# Patient Record
Sex: Male | Born: 2000 | Race: Black or African American | Hispanic: No | Marital: Single | Smoking: Never smoker
Health system: Southern US, Community
[De-identification: ages and names within clinical notes are randomized; demographics above are authoritative.]

## PROBLEM LIST (undated history)

## (undated) DIAGNOSIS — T7840XA Allergy, unspecified, initial encounter: Secondary | ICD-10-CM

## (undated) DIAGNOSIS — F909 Attention-deficit hyperactivity disorder, unspecified type: Secondary | ICD-10-CM

## (undated) DIAGNOSIS — L309 Dermatitis, unspecified: Secondary | ICD-10-CM

## (undated) HISTORY — DX: Allergy, unspecified, initial encounter: T78.40XA

## (undated) HISTORY — DX: Dermatitis, unspecified: L30.9

## (undated) HISTORY — DX: Attention-deficit hyperactivity disorder, unspecified type: F90.9

---
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
## 2021-10-04 ENCOUNTER — Ambulatory Visit: Payer: BC Managed Care – PPO | Admitting: Family Medicine

## 2021-10-04 ENCOUNTER — Other Ambulatory Visit: Payer: Self-pay

## 2021-10-04 ENCOUNTER — Encounter: Payer: Self-pay | Admitting: Family Medicine

## 2021-10-04 VITALS — BP 112/78 | HR 90 | Temp 97.6°F | Ht 65.0 in | Wt 131.2 lb

## 2021-10-04 DIAGNOSIS — Z209 Contact with and (suspected) exposure to unspecified communicable disease: Secondary | ICD-10-CM

## 2021-10-04 DIAGNOSIS — F988 Other specified behavioral and emotional disorders with onset usually occurring in childhood and adolescence: Secondary | ICD-10-CM

## 2021-10-04 DIAGNOSIS — Z1159 Encounter for screening for other viral diseases: Secondary | ICD-10-CM

## 2021-10-04 DIAGNOSIS — L309 Dermatitis, unspecified: Secondary | ICD-10-CM

## 2021-10-04 DIAGNOSIS — Z23 Encounter for immunization: Secondary | ICD-10-CM

## 2021-10-04 DIAGNOSIS — Z0000 Encounter for general adult medical examination without abnormal findings: Secondary | ICD-10-CM

## 2021-10-04 MED ORDER — AMPHETAMINE-DEXTROAMPHETAMINE 10 MG PO TABS: 10.0000 mg | ORAL_TABLET | Freq: Every day | ORAL | 0 refills | Status: DC

## 2021-10-04 NOTE — Patient Instructions (Signed)
Try the medication and let me know if it works, how long it works and did she have any trouble when you are on it or coming off

## 2021-10-04 NOTE — Progress Notes (Signed)
   Subjective:    Patient ID: Stephen Collier, male    DOB: 18-Apr-2001, 20 y.o.   MRN: [id number]  HPI He is here for complete examination.  He has no particular concerns or complaints.  Does indicate that he was diagnosed with ADD and did use a medication until he was 14 and then stopped it on his own.  Since then he has gone on and has finished his degree and is now finished an associates degree.  He is potentially interested in trying the medication again to see if it might possibly help with focus he does not smoke and rarely drinks.  He does have a history of eczema and does use topical medications.  He did have 1 sexual experience some with a man and did wear a condom.  Otherwise he says that he is not sexually active.  Social and family history was reviewed.   Review of Systems  All other systems reviewed and are negative.     Objective:   Physical Exam Alert and in no distress. Tympanic membranes and canals are normal. Pharyngeal area is normal. Neck is supple without adenopathy or thyromegaly. Cardiac exam shows a regular sinus rhythm without murmurs or gallops. Lungs are clear to auscultation.  Abdominal exam shows no masses or tenderness.  [name] shows normal circumcised male.  Penis and testes normal.  Exam of his arms does show hyper and hypopigmentation present. ADD screening does indicate that he indeed does have it.       Assessment & Plan:  Routine general medical examination at a health care facility - Plan: CBC with Differential/Platelet, Comprehensive metabolic panel, Lipid panel  Eczema, unspecified type  Contact with or exposure to communicable disease - Plan: RPR+HIV+GC+CT Panel  Attention deficit disorder, unspecified hyperactivity presence - Plan: amphetamine-dextroamphetamine (ADDERALL) 10 MG tablet  Need for Tdap vaccination - Plan: Tdap vaccine greater than or equal to 7yo IM  Need for influenza vaccination - Plan: Flu Vaccine QUAD 51mo+IM (Fluarix, Fluzone  & Alfiuria Quad PF)  Need for hepatitis C screening test - Plan: Hepatitis C antibody Continue to treat his eczema as needed.  I will do STD testing on him.  Discussed wearing condoms which she does.  Also discussed use of PrEP but at this point he is not interested as his sexual activity is quite limited.  I discussed possibly getting hepatitis A injections but again at this point he is not planning on becoming sexually active. Concerning his ADD, he will try the medicine.  He is supposed to let me know if it works and if he has any side effects from that.

## 2021-10-07 LAB — CBC WITH DIFFERENTIAL/PLATELET
Basophils Absolute: 0 10*3/uL (ref 0.0–0.2)
Basos: 1 %
EOS (ABSOLUTE): 0.1 10*3/uL (ref 0.0–0.4)
Eos: 3 %
Hematocrit: 46.3 % (ref 37.5–51.0)
Hemoglobin: 15.7 g/dL (ref 13.0–17.7)
Immature Grans (Abs): 0 10*3/uL (ref 0.0–0.1)
Immature Granulocytes: 0 %
Lymphocytes Absolute: 1.2 10*3/uL (ref 0.7–3.1)
Lymphs: 41 %
MCH: 29 pg (ref 26.6–33.0)
MCHC: 33.9 g/dL (ref 31.5–35.7)
MCV: 86 fL (ref 79–97)
Monocytes Absolute: 0.5 10*3/uL (ref 0.1–0.9)
Monocytes: 15 %
Neutrophils Absolute: 1.2 10*3/uL — ABNORMAL LOW (ref 1.4–7.0)
Neutrophils: 40 %
Platelets: 193 10*3/uL (ref 150–450)
RBC: 5.41 x10E6/uL (ref 4.14–5.80)
RDW: 12.9 % (ref 11.6–15.4)
WBC: 3 10*3/uL — ABNORMAL LOW (ref 3.4–10.8)

## 2021-10-07 LAB — LIPID PANEL
Chol/HDL Ratio: 4.3 ratio (ref 0.0–5.0)
Cholesterol, Total: 179 mg/dL (ref 100–199)
HDL: 42 mg/dL (ref >39)
LDL Chol Calc (NIH): 127 mg/dL — ABNORMAL HIGH (ref 0–99)
Triglycerides: 50 mg/dL (ref 0–149)
VLDL Cholesterol Cal: 10 mg/dL (ref 5–40)

## 2021-10-07 LAB — COMPREHENSIVE METABOLIC PANEL
ALT: 15 IU/L (ref 0–44)
AST: 18 IU/L (ref 0–40)
Albumin/Globulin Ratio: 1.8 (ref 1.2–2.2)
Albumin: 5.1 g/dL (ref 4.1–5.2)
Alkaline Phosphatase: 71 IU/L (ref 44–121)
BUN/Creatinine Ratio: 13 (ref 9–20)
BUN: 16 mg/dL (ref 6–20)
Bilirubin Total: 0.7 mg/dL (ref 0.0–1.2)
CO2: 24 mmol/L (ref 20–29)
Calcium: 9.9 mg/dL (ref 8.7–10.2)
Chloride: 103 mmol/L (ref 96–106)
Creatinine, Ser: 1.2 mg/dL (ref 0.76–1.27)
Globulin, Total: 2.9 g/dL (ref 1.5–4.5)
Glucose: 74 mg/dL (ref 70–99)
Potassium: 4.5 mmol/L (ref 3.5–5.2)
Sodium: 141 mmol/L (ref 134–144)
Total Protein: 8 g/dL (ref 6.0–8.5)
eGFR: 88 mL/min/{1.73_m2} (ref >59)

## 2021-10-07 LAB — HEPATITIS C ANTIBODY: Hep C Virus Ab: <0.1 s/co ratio (ref 0.0–0.9)

## 2021-10-07 LAB — RPR+HIV+GC+CT PANEL
Chlamydia trachomatis, NAA: NEGATIVE
HIV Screen 4th Generation wRfx: NONREACTIVE
Neisseria Gonorrhoeae by PCR: NEGATIVE
RPR Ser Ql: NONREACTIVE

## 2021-11-07 ENCOUNTER — Telehealth: Payer: Self-pay | Admitting: Family Medicine

## 2021-11-07 DIAGNOSIS — F988 Other specified behavioral and emotional disorders with onset usually occurring in childhood and adolescence: Secondary | ICD-10-CM

## 2021-11-07 MED ORDER — AMPHETAMINE-DEXTROAMPHETAMINE 10 MG PO TABS: 10.0000 mg | ORAL_TABLET | Freq: Every day | ORAL | 0 refills | Status: DC

## 2021-11-07 MED ORDER — METHYLPHENIDATE HCL 10 MG PO TABS: 10.0000 mg | ORAL_TABLET | Freq: Every day | ORAL | 0 refills | Status: DC

## 2021-11-07 NOTE — Telephone Encounter (Signed)
Called pt to advise. No answer and couldn't lvm. KH

## 2021-11-07 NOTE — Telephone Encounter (Signed)
Pt called and states that adderall 10 mg is working fine. He states that the only thing he noticed is that he does not have much of a appetite. Pt can be reached at [phone].

## 2021-11-07 NOTE — Addendum Note (Signed)
Addended by: Ronnald Nian on: 11/07/2021 01:53 PM   Modules accepted: Orders

## 2021-11-08 NOTE — Telephone Encounter (Signed)
Pt was advised KH 

## 2022-01-10 ENCOUNTER — Encounter: Payer: BC Managed Care – PPO | Admitting: Family Medicine

## 2022-07-04 ENCOUNTER — Telehealth: Payer: Self-pay | Admitting: Family Medicine

## 2022-07-04 NOTE — Telephone Encounter (Signed)
Called pt no answer, wouldn't let me leave message

## 2022-07-04 NOTE — Telephone Encounter (Signed)
Pt called and states he has decided he wants to be sexually active and would like you to call him to discuss this.  Please call his cell #[phone]

## 2022-07-05 NOTE — Telephone Encounter (Signed)
Appt made

## 2022-07-06 ENCOUNTER — Encounter: Payer: Self-pay | Admitting: Family Medicine

## 2022-07-06 ENCOUNTER — Ambulatory Visit: Payer: BC Managed Care – PPO | Admitting: Family Medicine

## 2022-07-06 VITALS — BP 110/70 | HR 98 | Temp 97.3°F | Wt 127.2 lb

## 2022-07-06 DIAGNOSIS — Z209 Contact with and (suspected) exposure to unspecified communicable disease: Secondary | ICD-10-CM | POA: Diagnosis not present

## 2022-07-06 DIAGNOSIS — Z23 Encounter for immunization: Secondary | ICD-10-CM | POA: Diagnosis not present

## 2022-07-06 NOTE — Patient Instructions (Signed)
For the Truvada you can take 2 pills prior to sexual activity and then 1 a day for 2 days. Take 2 of the doxycycline prior to sexual activity and then you are done

## 2022-07-06 NOTE — Progress Notes (Signed)
   Subjective:    Patient ID: Stephen Collier, male    DOB: [birth year], 21 y.o.   MRN: [id number]  HPI He is here for consult concerning starting PrEP.  He is up-to-date on hepatitis B vaccine but has not had hepatitis A.  He is also not have HPV vaccine.   Review of Systems     Objective:   Physical Exam Alert and in no distress otherwise not examined       Assessment & Plan:  Need for HPV vaccination - Plan: HPV 9-valent vaccine,Recombinat  Need for influenza vaccination - Plan: CANCELED: Flu Vaccine QUAD 37mo+IM (Fluarix, Fluzone & Alfiuria Quad PF)  Need for hepatitis A vaccination - Plan: Hepatitis A vaccine adult IM  Contact with or exposure to communicable disease - Plan: RPR+HIV+GC+CT Panel I will get his immunizations up-to-date with HPV as well as hepatitis A.  We will also make sure he does not have any STDs.  Then discussed the use of Truvada/Descovy taking it on a daily basis.  Also discussed the possibility of taking it on an as-needed basis using the protocol of 2 initially and then 1 a day for 2 days.  Also discussed the use of once every 2 months shots for prevention.  I also discussed prevention of other STDs with use of doxycycline as well as definitely always using condoms.  Recommend follow-up on a 80-month basis on that.

## 2022-07-07 LAB — RPR+HIV+GC+CT PANEL
HIV Screen 4th Generation wRfx: NONREACTIVE
RPR Ser Ql: NONREACTIVE

## 2022-07-08 LAB — RPR+HIV+GC+CT PANEL: Chlamydia trachomatis, NAA: NEGATIVE

## 2022-07-09 MED ORDER — EMTRICITABINE-TENOFOVIR DF 200-300 MG PO TABS: 1.0000 | ORAL_TABLET | Freq: Every day | ORAL | 0 refills | Status: DC

## 2022-07-09 NOTE — Addendum Note (Signed)
Addended by: Ronnald Nian on: 07/09/2022 11:43 AM   Modules accepted: Orders

## 2022-07-11 NOTE — Progress Notes (Signed)
Called pt and advised of results and Truvada

## 2022-07-31 ENCOUNTER — Other Ambulatory Visit: Payer: Self-pay | Admitting: Family Medicine

## 2022-07-31 MED ORDER — EMTRICITABINE-TENOFOVIR DF 200-300 MG PO TABS: 1.0000 | ORAL_TABLET | Freq: Every day | ORAL | 0 refills | Status: DC

## 2022-10-04 ENCOUNTER — Other Ambulatory Visit: Payer: Self-pay | Admitting: Family Medicine

## 2022-10-04 NOTE — Telephone Encounter (Signed)
Refill request last apt 07/06/22 next apt 10/10/22.

## 2022-10-10 ENCOUNTER — Ambulatory Visit: Payer: BC Managed Care – PPO | Admitting: Family Medicine

## 2022-10-10 ENCOUNTER — Encounter: Payer: Self-pay | Admitting: Family Medicine

## 2022-10-10 VITALS — BP 100/64 | HR 80 | Ht 65.0 in | Wt 124.0 lb

## 2022-10-10 DIAGNOSIS — Z23 Encounter for immunization: Secondary | ICD-10-CM | POA: Diagnosis not present

## 2022-10-10 DIAGNOSIS — F909 Attention-deficit hyperactivity disorder, unspecified type: Secondary | ICD-10-CM | POA: Diagnosis not present

## 2022-10-10 DIAGNOSIS — Z0000 Encounter for general adult medical examination without abnormal findings: Secondary | ICD-10-CM

## 2022-10-10 DIAGNOSIS — Z209 Contact with and (suspected) exposure to unspecified communicable disease: Secondary | ICD-10-CM

## 2022-10-10 LAB — POCT URINALYSIS DIP (PROADVANTAGE DEVICE)
Bilirubin, UA: NEGATIVE
Blood, UA: NEGATIVE
Glucose, UA: NEGATIVE mg/dL
Ketones, POC UA: NEGATIVE mg/dL
Leukocytes, UA: NEGATIVE
Nitrite, UA: NEGATIVE
Specific Gravity, Urine: 1.03
Urobilinogen, Ur: 0.2
pH, UA: 6 (ref 5.0–8.0)

## 2022-10-10 NOTE — Progress Notes (Signed)
Complete physical exam  Patient: Stephen Collier   DOB: [birth year]   21 y.o. Male  MRN: [id number]  Subjective:    Chief Complaint  Patient presents with   Annual Exam    Nonfasting annual exam, no concerns. Was not able to give UA on way in. Would you recommend any daily vitamins?    Stephen Collier is a 21 y.o. male who presents today for a complete physical exam. He reports consuming a  regular  diet. The patient does not participate in regular exercise at present. He generally feels well. He reports sleeping well. He does have eczema and recently saw dermatologist.  He was offered various modalities but he is going to continue to use clobetasol.  He also has a previous history of ADD but presently is on no medication and has no desire to go on it anytime soon.  He also has not sexually active and has not been so in several months.  He did stop Truvada.  He does not smoke or drink.  Family and social history as well as health maintenance and immunizations was reviewed Most recent fall risk assessment:    10/10/2022    1:46 PM  [place] in the past year? 0  Number falls in past yr: 0  Injury with Fall? 0  Risk for fall due to : No Fall Risks  Follow up Falls evaluation completed     Most recent depression screenings:    10/10/2022    1:46 PM 10/04/2021    2:54 PM  PHQ 2/9 Scores  PHQ - 2 Score 0 0    Vision:Not within last year  and went last year.    Patient Care Team: Denita Lung, MD as PCP - General (Family Medicine)   Outpatient Medications Prior to Visit  Medication Sig Note   Fluocinolone Acetonide Scalp 0.01 % OIL Apply topically as needed. 10/10/2022: Used today   halobetasol (ULTRAVATE) 0.05 % ointment Apply topically 2 (two) times daily as needed. 10/10/2022: Used today   emtricitabine-tenofovir (TRUVADA) 200-300 MG tablet TAKE 1 TABLET BY MOUTH EVERY DAY (Patient not taking: Reported on 10/10/2022)    [DISCONTINUED] amoxicillin (AMOXIL) 875 MG  tablet Take 1 tablet (875 mg total) by mouth 2 (two) times daily. (Patient not taking: Reported on 10/04/2021)    [DISCONTINUED] methylphenidate (RITALIN) 10 MG tablet Take 1 tablet (10 mg total) by mouth daily.    No facility-administered medications prior to visit.    Review of Systems  All other systems reviewed and are negative.         Objective:        Physical Exam  Alert and in no distress. Tympanic membranes and canals are normal. Pharyngeal area is normal. Neck is supple without adenopathy or thyromegaly. Cardiac exam shows a regular sinus rhythm without murmurs or gallops. Lungs are clear to auscultation.  Abdominal exam shows no masses or tenderness.  Genitalia normal circumcised male with normal penis and testes.      Assessment & Plan:    Contact with or exposure to communicable disease - Plan: RPR+HIV+GC+CT Panel  Routine general medical examination at a health care facility  Attention deficit hyperactivity disorder (ADHD), unspecified ADHD type  Immunization History  Administered Date(s) Administered   DTaP 04/04/2000, 06/04/2000, 08/03/2000, 05/24/2001   HIB (PRP-OMP) 04/04/2000, 06/04/2000, 08/03/2000, 05/24/2001   HPV 9-valent 07/06/2022   Hepatitis A, Adult 07/06/2022   Hepatitis B 06/15/00, 02/22/2000, 08/03/2000   IPV  04/04/2000, 06/04/2000, 08/03/2000   Influenza,inj,Quad PF,6+ Mos 10/04/2021   MMR 05/24/2001   PFIZER(Purple Top)SARS-COV-2 Vaccination 02/26/2020, 03/18/2020   Pneumococcal Conjugate-13 05/24/2001   Tdap 07/07/2011, 10/04/2021   Varicella 05/24/2001    Health Maintenance  Topic Date Due   INFLUENZA VACCINE  05/30/2022   COVID-19 Vaccine (3 - 2023-24 season) 06/30/2022   HPV VACCINES (2 - Male 3-dose series) 08/03/2022   DTaP/Tdap/Td (7 - Td or Tdap) 10/05/2031   Hepatitis C Screening  Completed   HIV Screening  Completed    Discussed health benefits of physical activity, and encouraged him to engage in regular exercise  appropriate for his age and condition.  Problem List Items Addressed This Visit   None Visit Diagnoses     Contact with or exposure to communicable disease    -  Primary   Relevant Orders   RPR+HIV+GC+CT Panel   Routine general medical examination at a health care facility       Attention deficit hyperactivity disorder (ADHD), unspecified ADHD type          He will stop taking his Truvada.  Continue to use the clobetasol.  Discussed the use of ADD meds but at this time he is not interested in pursuing that.    Jill Alexanders, MD

## 2022-10-13 LAB — RPR+HIV+GC+CT PANEL
Chlamydia trachomatis, NAA: NEGATIVE
HIV Screen 4th Generation wRfx: NONREACTIVE
Neisseria Gonorrhoeae by PCR: NEGATIVE
RPR Ser Ql: NONREACTIVE

## 2023-07-17 ENCOUNTER — Other Ambulatory Visit (INDEPENDENT_AMBULATORY_CARE_PROVIDER_SITE_OTHER): Payer: BC Managed Care – PPO

## 2023-07-17 DIAGNOSIS — Z23 Encounter for immunization: Secondary | ICD-10-CM | POA: Diagnosis not present

## 2024-02-27 DIAGNOSIS — L2089 Other atopic dermatitis: Secondary | ICD-10-CM | POA: Diagnosis not present
